# Patient Record
Sex: Male | Born: 1985 | Race: Black or African American | Hispanic: No | Marital: Single | Smoking: Current every day smoker
Health system: Southern US, Community
[De-identification: ages and names within clinical notes are randomized; demographics above are authoritative.]

---

## 2014-08-11 ENCOUNTER — Emergency Department (HOSPITAL_COMMUNITY): Payer: Self-pay

## 2014-08-11 ENCOUNTER — Emergency Department (HOSPITAL_COMMUNITY)
Admission: EM | Admit: 2014-08-11 | Discharge: 2014-08-11 | Disposition: A | Discharge status: Home / Self Care | Payer: Self-pay | Attending: Emergency Medicine | Admitting: Emergency Medicine

## 2014-08-11 ENCOUNTER — Encounter (HOSPITAL_COMMUNITY): Payer: Self-pay | Admitting: Physical Medicine and Rehabilitation

## 2014-08-11 DIAGNOSIS — Y998 Other external cause status: Secondary | ICD-10-CM | POA: Insufficient documentation

## 2014-08-11 DIAGNOSIS — M25512 Pain in left shoulder: Secondary | ICD-10-CM

## 2014-08-11 DIAGNOSIS — Y9389 Activity, other specified: Secondary | ICD-10-CM | POA: Insufficient documentation

## 2014-08-11 DIAGNOSIS — W06XXXA Fall from bed, initial encounter: Secondary | ICD-10-CM | POA: Insufficient documentation

## 2014-08-11 DIAGNOSIS — Z720 Tobacco use: Secondary | ICD-10-CM | POA: Insufficient documentation

## 2014-08-11 DIAGNOSIS — Y9289 Other specified places as the place of occurrence of the external cause: Secondary | ICD-10-CM | POA: Insufficient documentation

## 2014-08-11 DIAGNOSIS — Z880 Allergy status to penicillin: Secondary | ICD-10-CM | POA: Insufficient documentation

## 2014-08-11 DIAGNOSIS — M1990 Unspecified osteoarthritis, unspecified site: Secondary | ICD-10-CM | POA: Insufficient documentation

## 2014-08-11 DIAGNOSIS — S4992XA Unspecified injury of left shoulder and upper arm, initial encounter: Secondary | ICD-10-CM | POA: Insufficient documentation

## 2014-08-11 DIAGNOSIS — M791 Myalgia: Secondary | ICD-10-CM | POA: Insufficient documentation

## 2014-08-11 MED ORDER — NAPROXEN 500 MG PO TABS: 500.0000 mg | ORAL_TABLET | Freq: Two times a day (BID) | ORAL | Status: AC

## 2014-08-11 NOTE — Discharge Instructions (Signed)
Start shoulder exercises. Ice shoulder several times a day. Do not lift anything heavier than 1 lb for 1-2 weeks. [place] for pain and inflammation. Follow up with your doctor or orthopedics specialist.   Shoulder Exercises EXERCISES  RANGE OF MOTION (ROM) AND STRETCHING EXERCISES These exercises may help you when beginning to rehabilitate your injury. Your symptoms may resolve with or without further involvement from your physician, physical therapist or athletic trainer. While completing these exercises, remember:   Restoring tissue flexibility helps normal motion to return to the joints. This allows healthier, less painful movement and activity.  An effective stretch should be held for at least 30 seconds.  A stretch should never be painful. You should only feel a gentle lengthening or release in the stretched tissue. ROM - Pendulum  Bend at the waist so that your right / left arm falls away from your body. Support yourself with your opposite hand on a solid surface, such as a table or a countertop.  Your right / left arm should be perpendicular to the ground. If it is not perpendicular, you need to lean over farther. Relax the muscles in your right / left arm and shoulder as much as possible.  Gently sway your hips and trunk so they move your right / left arm without any use of your right / left shoulder muscles.  Progress your movements so that your right / left arm moves side to side, then forward and backward, and finally, both clockwise and counterclockwise.  Complete __________ repetitions in each direction. Many people use this exercise to relieve discomfort in their shoulder as well as to gain range of motion. Repeat __________ times. Complete this exercise __________ times per day. STRETCH - Flexion, Standing  Stand with good posture. With an underhand grip on your right / left hand and an overhand grip on the opposite hand, grasp a broomstick or cane so that your hands are a  little more than shoulder-width apart.  Keeping your right / left elbow straight and shoulder muscles relaxed, push the stick with your opposite hand to raise your right / left arm in front of your body and then overhead. Raise your arm until you feel a stretch in your right / left shoulder, but before you have increased shoulder pain.  Try to avoid shrugging your right / left shoulder as your arm rises by keeping your shoulder blade tucked down and toward your mid-back spine. Hold __________ seconds.  Slowly return to the starting position. Repeat __________ times. Complete this exercise __________ times per day. STRETCH - Internal Rotation  Place your right / left hand behind your back, palm-up.  Throw a towel or belt over your opposite shoulder. Grasp the towel/belt with your right / left hand.  While keeping an upright posture, gently pull up on the towel/belt until you feel a stretch in the front of your right / left shoulder.  Avoid shrugging your right / left shoulder as your arm rises by keeping your shoulder blade tucked down and toward your mid-back spine.  Hold __________. Release the stretch by lowering your opposite hand. Repeat __________ times. Complete this exercise __________ times per day. STRETCH - External Rotation and Abduction  Stagger your stance through a doorframe. It does not matter which foot is forward.  As instructed by your physician, physical therapist or athletic trainer, place your hands:  And forearms above your head and on the door frame.  And forearms at head-height and on the door frame.  At elbow-height and on the door frame.  Keeping your head and chest upright and your stomach muscles tight to prevent over-extending your low-back, slowly shift your weight onto your front foot until you feel a stretch across your chest and/or in the front of your shoulders.  Hold __________ seconds. Shift your weight to your back foot to release the  stretch. Repeat __________ times. Complete this stretch __________ times per day.  STRENGTHENING EXERCISES  These exercises may help you when beginning to rehabilitate your injury. They may resolve your symptoms with or without further involvement from your physician, physical therapist or athletic trainer. While completing these exercises, remember:   Muscles can gain both the endurance and the strength needed for everyday activities through controlled exercises.  Complete these exercises as instructed by your physician, physical therapist or athletic trainer. Progress the resistance and repetitions only as guided.  You may experience muscle soreness or fatigue, but the pain or discomfort you are trying to eliminate should never worsen during these exercises. If this pain does worsen, stop and make certain you are following the directions exactly. If the pain is still present after adjustments, discontinue the exercise until you can discuss the trouble with your clinician.  If advised by your physician, during your recovery, avoid activity or exercises which involve actions that place your right / left hand or elbow above your head or behind your back or head. These positions stress the tissues which are trying to heal. STRENGTH - Scapular Depression and Adduction  With good posture, sit on a firm chair. Supported your arms in front of you with pillows, arm rests or a table top. Have your elbows in line with the sides of your body.  Gently draw your shoulder blades down and toward your mid-back spine. Gradually increase the tension without tensing the muscles along the top of your shoulders and the back of your neck.  Hold for __________ seconds. Slowly release the tension and relax your muscles completely before completing the next repetition.  After you have practiced this exercise, remove the arm support and complete it in standing as well as sitting. Repeat __________ times. Complete this  exercise __________ times per day.  STRENGTH - External Rotators  Secure a rubber exercise band/tubing to a fixed object so that it is at the same height as your right / left elbow when you are standing or sitting on a firm surface.  Stand or sit so that the secured exercise band/tubing is at your side that is not injured.  Bend your elbow 90 degrees. Place a folded towel or small pillow under your right / left arm so that your elbow is a few inches away from your side.  Keeping the tension on the exercise band/tubing, pull it away from your body, as if pivoting on your elbow. Be sure to keep your body steady so that the movement is only coming from your shoulder rotating.  Hold __________ seconds. Release the tension in a controlled manner as you return to the starting position. Repeat __________ times. Complete this exercise __________ times per day.  STRENGTH - Supraspinatus  Stand or sit with good posture. Grasp a __________ weight or an exercise band/tubing so that your hand is "thumbs-up," like when you shake hands.  Slowly lift your right / left hand from your thigh into the air, traveling about 30 degrees from straight out at your side. Lift your hand to shoulder height or as far as you can without increasing any  shoulder pain. Initially, many people do not lift their hands above shoulder height.  Avoid shrugging your right / left shoulder as your arm rises by keeping your shoulder blade tucked down and toward your mid-back spine.  Hold for __________ seconds. Control the descent of your hand as you slowly return to your starting position. Repeat __________ times. Complete this exercise __________ times per day.  STRENGTH - Shoulder Extensors  Secure a rubber exercise band/tubing so that it is at the height of your shoulders when you are either standing or sitting on a firm arm-less chair.  With a thumbs-up grip, grasp an end of the band/tubing in each hand. Straighten your elbows  and lift your hands straight in front of you at shoulder height. Step back away from the secured end of band/tubing until it becomes tense.  Squeezing your shoulder blades together, pull your hands down to the sides of your thighs. Do not allow your hands to go behind you.  Hold for __________ seconds. Slowly ease the tension on the band/tubing as you reverse the directions and return to the starting position. Repeat __________ times. Complete this exercise __________ times per day.  STRENGTH - Scapular Retractors  Secure a rubber exercise band/tubing so that it is at the height of your shoulders when you are either standing or sitting on a firm arm-less chair.  With a palm-down grip, grasp an end of the band/tubing in each hand. Straighten your elbows and lift your hands straight in front of you at shoulder height. Step back away from the secured end of band/tubing until it becomes tense.  Squeezing your shoulder blades together, draw your elbows back as you bend them. Keep your upper arm lifted away from your body throughout the exercise.  Hold __________ seconds. Slowly ease the tension on the band/tubing as you reverse the directions and return to the starting position. Repeat __________ times. Complete this exercise __________ times per day. STRENGTH - Scapular Depressors  Find a sturdy chair without wheels, such as a from a dining room table.  Keeping your feet on the floor, lift your bottom from the seat and lock your elbows.  Keeping your elbows straight, allow gravity to pull your body weight down. Your shoulders will rise toward your ears.  Raise your body against gravity by drawing your shoulder blades down your back, shortening the distance between your shoulders and ears. Although your feet should always maintain contact with the floor, your feet should progressively support less body weight as you get stronger.  Hold __________ seconds. In a controlled and slow manner, lower  your body weight to begin the next repetition. Repeat __________ times. Complete this exercise __________ times per day.  Document Released: 05/09/2005 Document Revised: 09/17/2011 Document Reviewed: 10/07/2008 [facility] Patient Information 2015 [place], [place]. This information is not intended to replace advice given to you by your health care provider. Make sure you discuss any questions you have with your health care provider.

## 2014-08-11 NOTE — ED Notes (Signed)
Pt presents to department for evaluation of L shoulder pain and swelling. Ongoing x2 week. 9/10 pain at present. NAD.

## 2014-08-11 NOTE — ED Provider Notes (Signed)
CSN: [id number]     Arrival date & time 08/11/14  1033 History  This chart was scribed for non-physician practitioner working with Flint Melter, MD by Richarda Overlie, ED Scribe. This patient was seen in room TR10C/TR10C and the patient's care was started at 11:58 AM.    Chief Complaint  Patient presents with  . Shoulder Pain   The history is provided by the patient. No language interpreter was used.   HPI Comments: Ryan Caldwell is a 29 y.o. male who presents to the Emergency Department complaining of left shoulder pain for the last 2 weeks. He rates his pain as a 9/10 at this time. Pt states that the he fell off a top bunk 2 weeks ago and has had pain since then. Pt says that his shoulder has been "popping in and out." He states that he is not able to lift anything and says that certain movements worsen his pain. Pt states he has taken no medications for his symptoms. He reports he had rotator cuff surgery in 2008 and says he has been told that he needs surgery again. Pt has chronic left shoulder pain. New to this area, no pcp or orthopedics. Pt reports no alleviating factors at this time.   History reviewed. No pertinent past medical history. History reviewed. No pertinent past surgical history. No family history on file. History  Substance Use Topics  . Smoking status: Current Every Day Smoker  . Smokeless tobacco: Not on file  . Alcohol Use: No    Review of Systems  Constitutional: Negative for fever.  Musculoskeletal: Positive for myalgias and arthralgias.      Allergies  Penicillins  Home Medications   Prior to Admission medications   Not on File   BP 105/57 mmHg  Pulse 67  Temp(Src) 98 F (36.7 C) (Oral)  Resp 18  SpO2 99% Physical Exam  Constitutional: He is oriented to person, place, and time. He appears well-developed and well-nourished.  HENT:  Head: Normocephalic and atraumatic.  Eyes: Right eye exhibits no discharge. Left eye exhibits no discharge.   Neck: Neck supple. No tracheal deviation present.  Cardiovascular: Normal rate.   Pulmonary/Chest: Effort normal. No respiratory distress.  Abdominal: He exhibits no distension.  Musculoskeletal:  Normal appearing left shoulder. Tender to palpation mainly over anterior left shoulder joint. Full range of motion of the joint. Pain with full flexion, internal and external rotation. Negative arm drop test. Biceps tendon is intact. Strength is 55 and equal over bicep, tricep, grip strength. Distal radial pulses intact.  Neurological: He is alert and oriented to person, place, and time.  Skin: Skin is warm and dry.  Psychiatric: He has a normal mood and affect. His behavior is normal.  Nursing note and vitals reviewed.   ED Course  Procedures   DIAGNOSTIC STUDIES: Oxygen Saturation is 99% on RA, normal by my interpretation.    COORDINATION OF CARE: 12:05 PM Discussed treatment plan with pt at bedside and pt agreed to plan.   Labs Review Labs Reviewed - No data to display  Imaging Review Dg Shoulder Left  08/11/2014   CLINICAL DATA:  Pain and swelling in left shoulder for 1 week after falling off bed. History of rotator cuff surgery a few years ago.  EXAM: LEFT SHOULDER - 2+ VIEW  COMPARISON:  None.  FINDINGS: There is no evidence of fracture or dislocation. There is no evidence of arthropathy or other focal bone abnormality. Soft tissues are unremarkable.  IMPRESSION: Negative.  Electronically Signed   By: Oley Balmaniel  Hassell M.D.   On: 08/11/2014 11:53     EKG Interpretation None      MDM   Final diagnoses:  Left shoulder pain   Patient with chronic left shoulder pain, prior rotator cuff injury. Worsening pain over last 2 days after falling off the bunk beds. Neurovascularly intact. X-rays negative. Full range of motion of the joint. Will discharge home with NSAIDs, rest, start exercises, follow-up with orthopedics.  Filed Vitals:   08/11/14 1057  BP: 105/57  Pulse: 67  Temp: 98  F (36.7 C)  TempSrc: Oral  Resp: 18  SpO2: 99%    I personally performed the services described in this documentation, which was scribed in my presence. The recorded information has been reviewed and is accurate.     Lottie Musselatyana A Dymir Neeson, PA-C 08/11/14 1211  Flint MelterElliott L Wentz, MD 08/12/14 1539

## 2015-09-15 IMAGING — CR DG SHOULDER 2+V*L*
3 series · 3 of 3 positions shown · non-contrast
Comparison: None.

CLINICAL DATA: Pain and swelling in left shoulder for 1 week after
falling off bed. History of rotator cuff surgery a few years ago.

EXAM:
LEFT SHOULDER - 2+ VIEW

[w shoulder internal left]
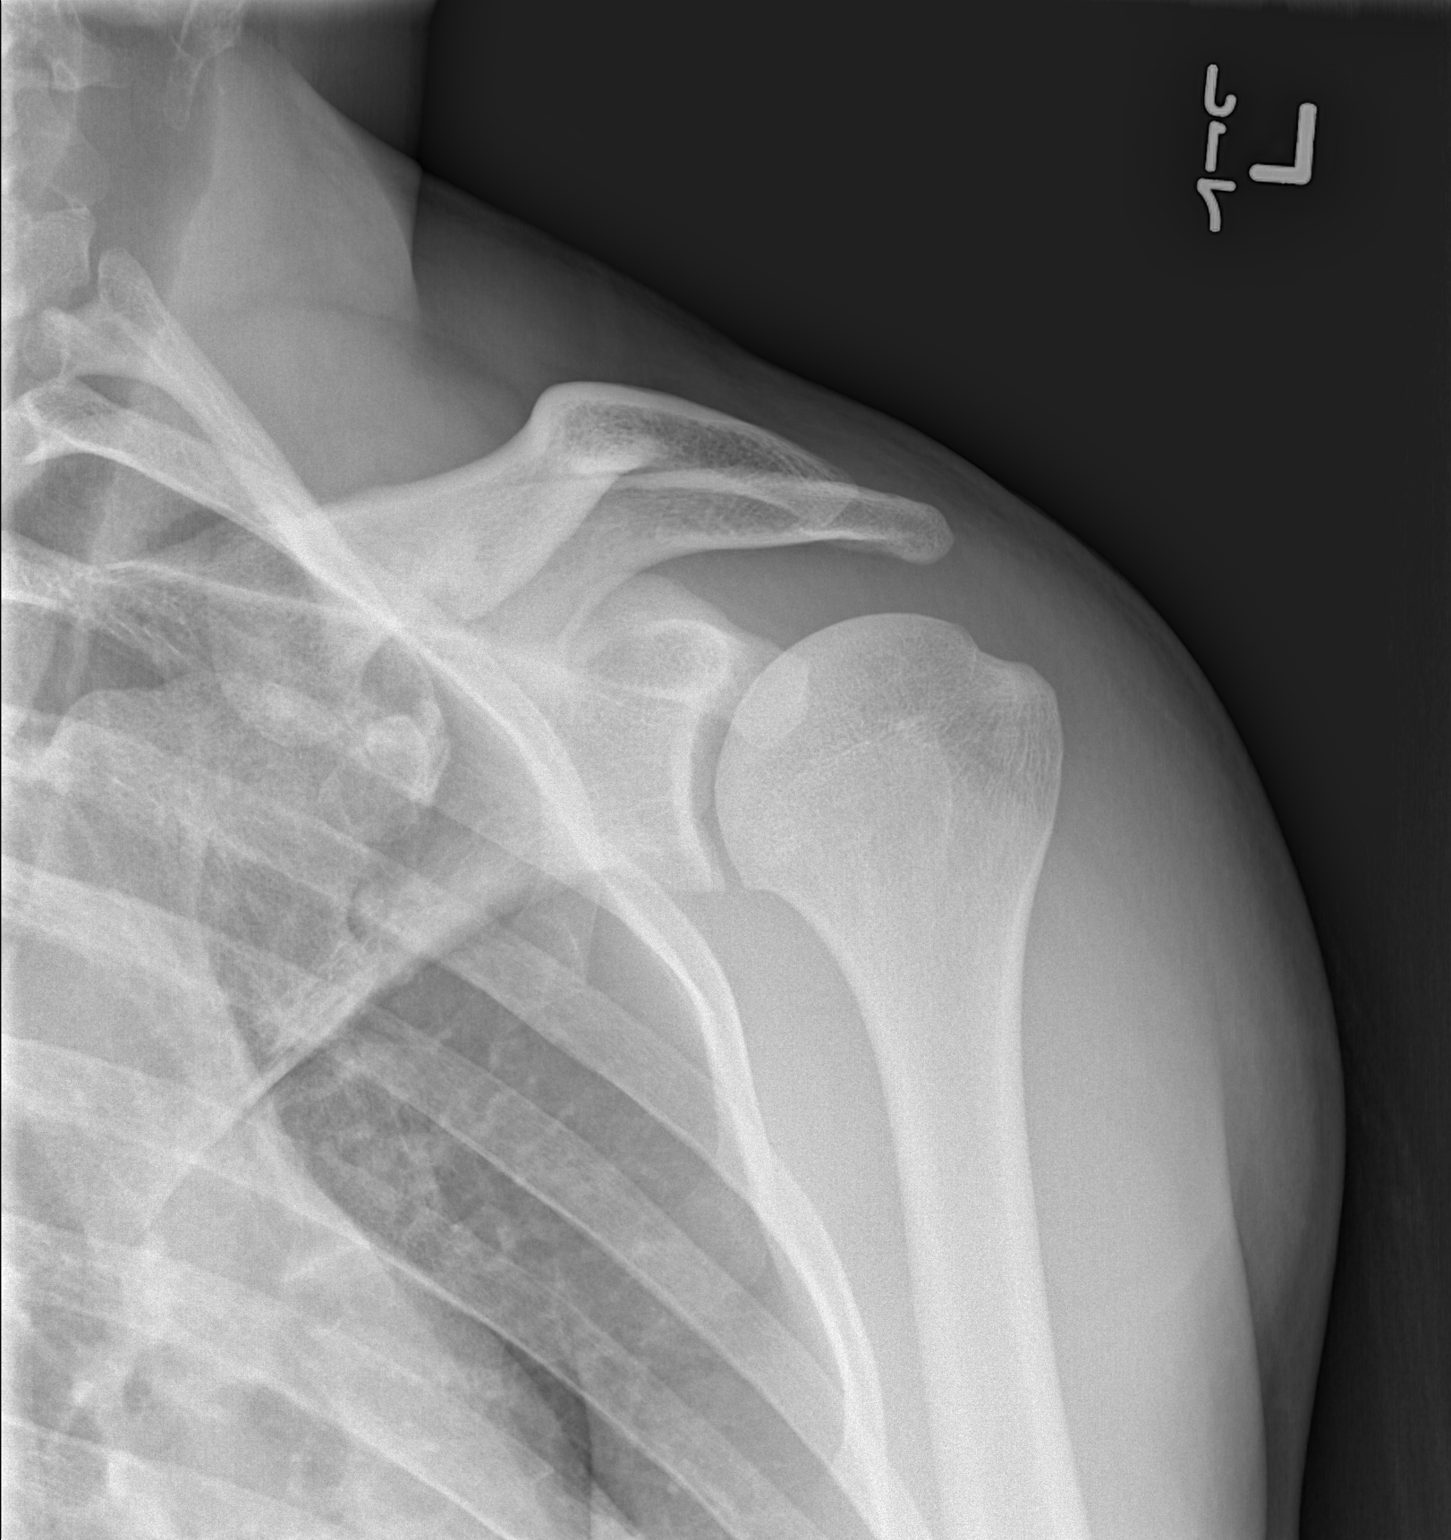

[w shoulder external left]
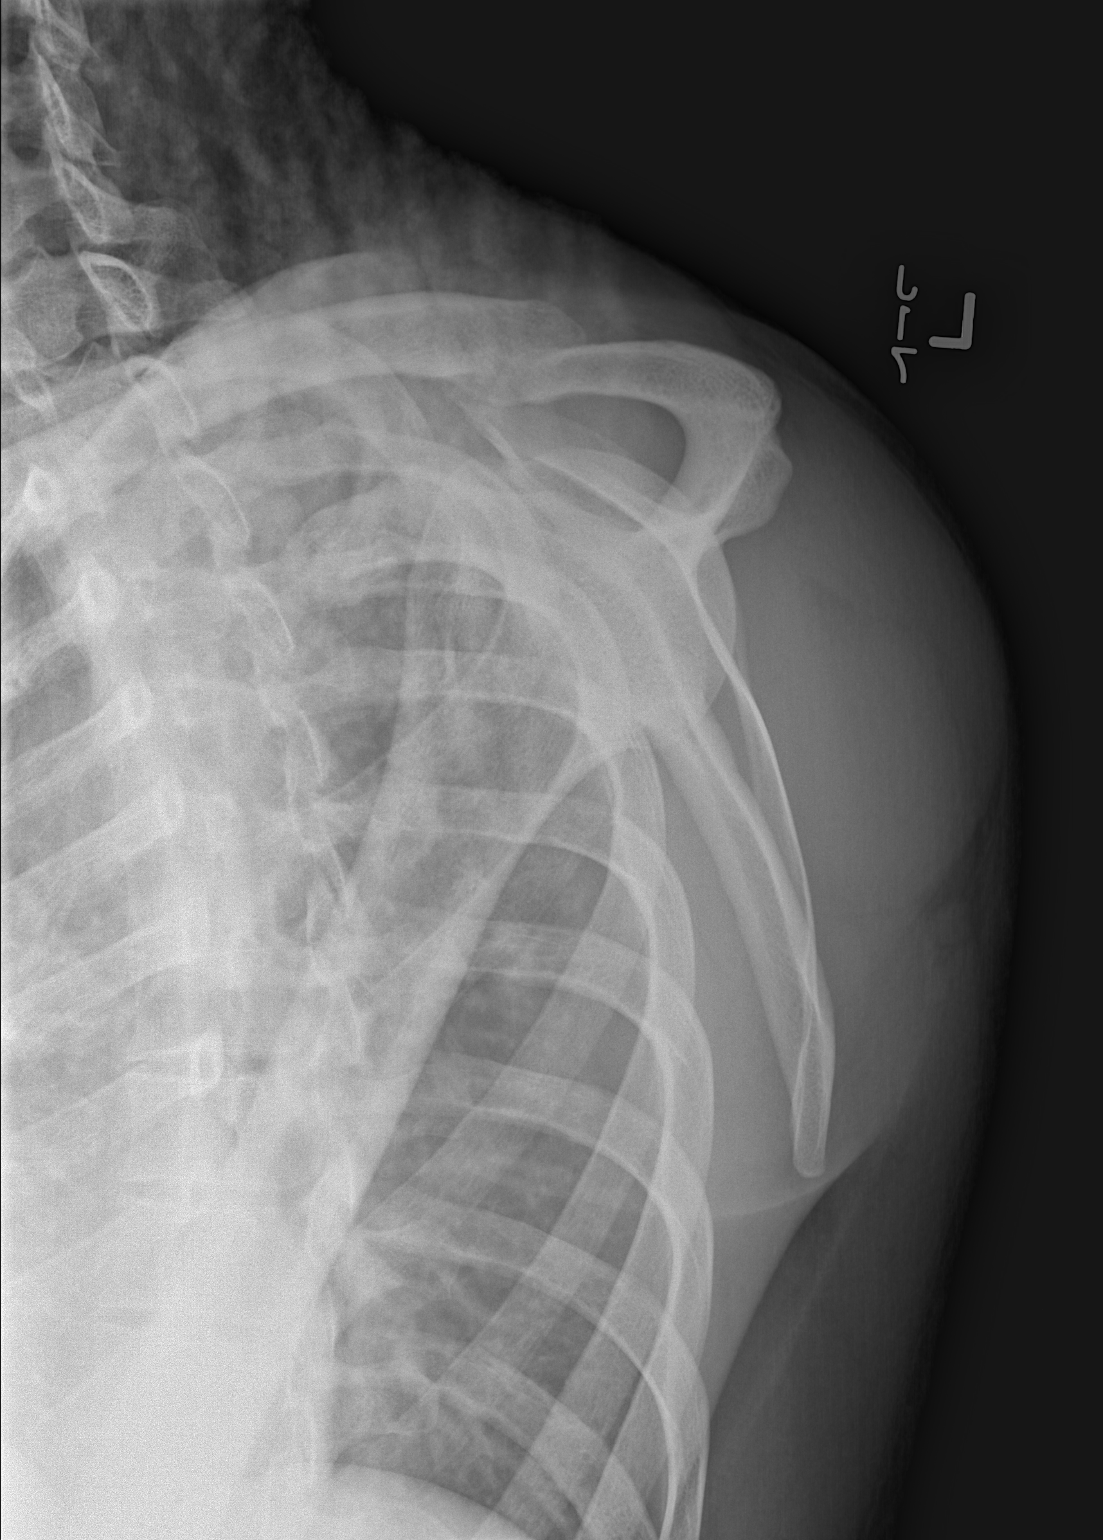

[x shoulder axillary left]
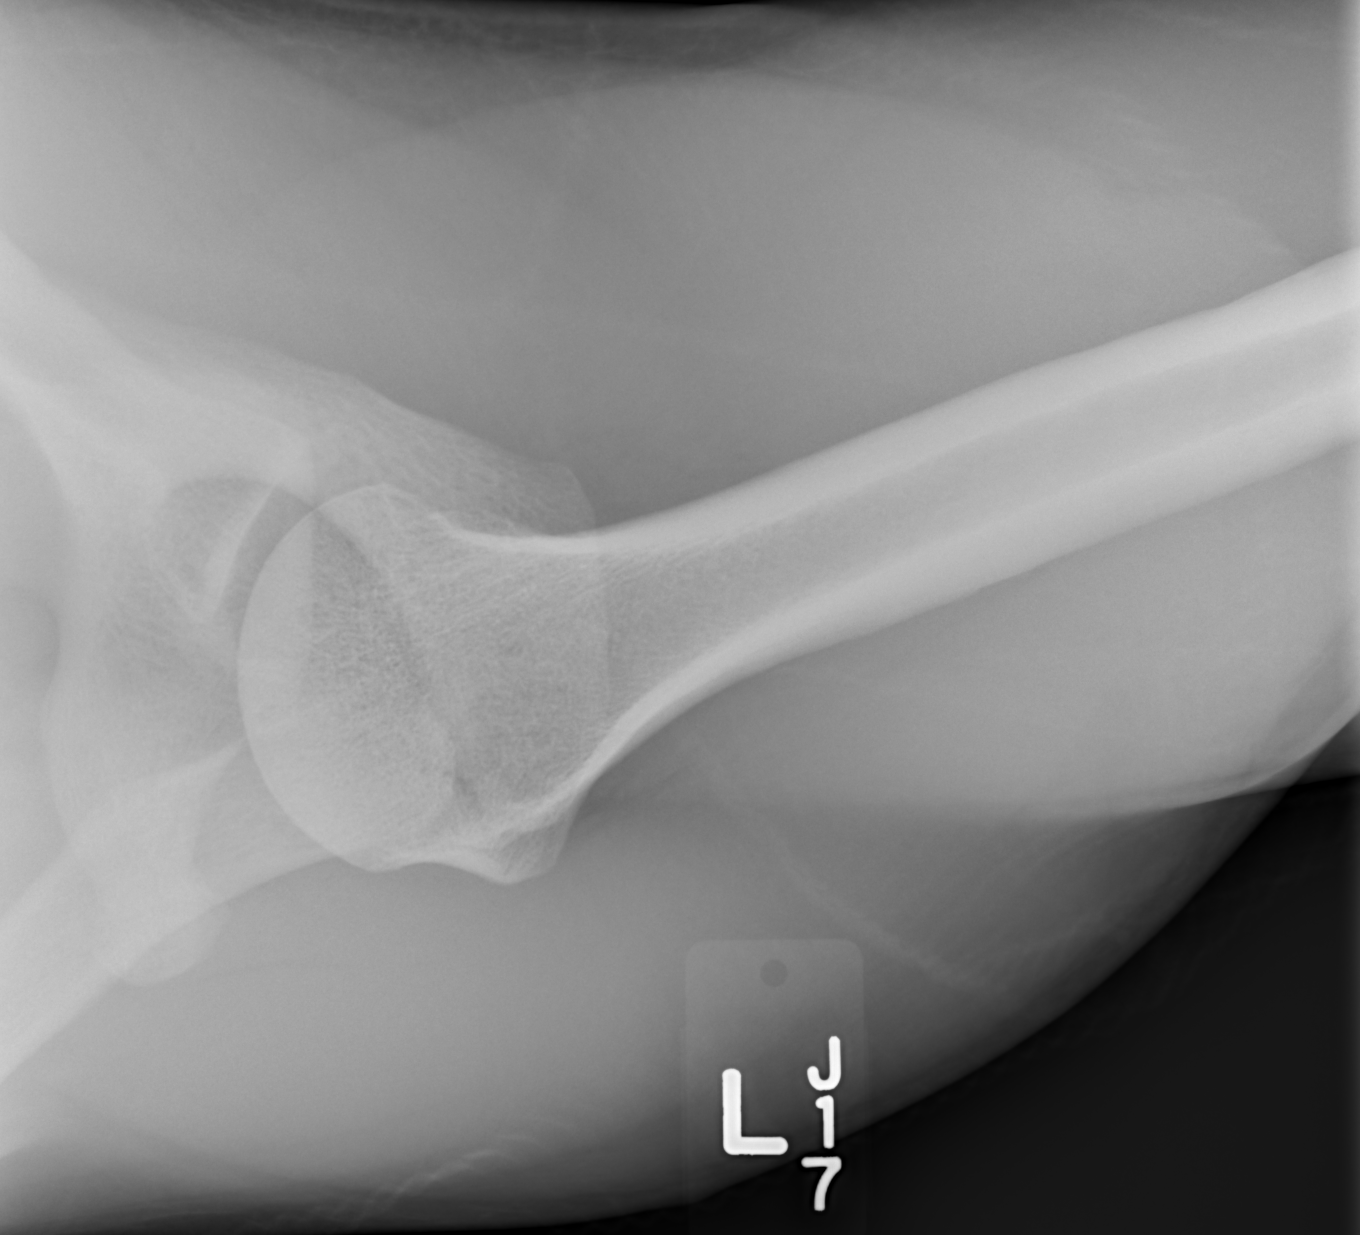

[3 of 3 positions shown; findings below may reference images not displayed]

FINDINGS: There is no evidence of fracture or dislocation. There is no
evidence of arthropathy or other focal bone abnormality. Soft
tissues are unremarkable.
IMPRESSION: Negative.
